# Patient Record
Sex: Male | Born: 1970 | Race: Black or African American | Hispanic: No | Marital: Single | Smoking: Current every day smoker
Health system: Southern US, Community
[De-identification: ages and names within clinical notes are randomized; demographics above are authoritative.]

## PROBLEM LIST (undated history)

## (undated) DIAGNOSIS — W3400XA Accidental discharge from unspecified firearms or gun, initial encounter: Secondary | ICD-10-CM

---

## 2014-08-09 DIAGNOSIS — W3400XA Accidental discharge from unspecified firearms or gun, initial encounter: Secondary | ICD-10-CM

## 2014-08-09 HISTORY — DX: Accidental discharge from unspecified firearms or gun, initial encounter: W34.00XA

## 2015-08-21 ENCOUNTER — Emergency Department (HOSPITAL_COMMUNITY)
Admission: EM | Admit: 2015-08-21 | Discharge: 2015-08-21 | Disposition: A | Discharge status: Home / Self Care | Payer: Self-pay | Attending: Emergency Medicine | Admitting: Emergency Medicine

## 2015-08-21 ENCOUNTER — Emergency Department (HOSPITAL_COMMUNITY): Payer: Self-pay

## 2015-08-21 ENCOUNTER — Encounter (HOSPITAL_COMMUNITY): Payer: Self-pay

## 2015-08-21 DIAGNOSIS — S79912A Unspecified injury of left hip, initial encounter: Secondary | ICD-10-CM | POA: Insufficient documentation

## 2015-08-21 DIAGNOSIS — Y9389 Activity, other specified: Secondary | ICD-10-CM | POA: Insufficient documentation

## 2015-08-21 DIAGNOSIS — Y998 Other external cause status: Secondary | ICD-10-CM | POA: Insufficient documentation

## 2015-08-21 DIAGNOSIS — F17210 Nicotine dependence, cigarettes, uncomplicated: Secondary | ICD-10-CM | POA: Insufficient documentation

## 2015-08-21 DIAGNOSIS — Y92410 Unspecified street and highway as the place of occurrence of the external cause: Secondary | ICD-10-CM | POA: Insufficient documentation

## 2015-08-21 HISTORY — DX: Accidental discharge from unspecified firearms or gun, initial encounter: W34.00XA

## 2015-08-21 MED ORDER — HYDROMORPHONE HCL 1 MG/ML IJ SOLN
2.0000 mg | Freq: Once | INTRAMUSCULAR | Status: DC
  Filled 2015-08-21: qty 2

## 2015-08-21 MED ORDER — HYDROMORPHONE HCL 1 MG/ML IJ SOLN
1.0000 mg | Freq: Once | INTRAMUSCULAR | Status: AC
  Administered 2015-08-21: 1 mg via INTRAVENOUS

## 2015-08-21 MED ORDER — FENTANYL CITRATE (PF) 100 MCG/2ML IJ SOLN
50.0000 ug | Freq: Once | INTRAMUSCULAR | Status: DC
Start: 2015-08-21 — End: 2015-08-21
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
## 2015-08-30 ENCOUNTER — Ambulatory Visit (INDEPENDENT_AMBULATORY_CARE_PROVIDER_SITE_OTHER): Payer: BLUE CROSS/BLUE SHIELD | Admitting: Family Medicine

## 2015-08-30 ENCOUNTER — Encounter: Payer: Self-pay | Admitting: Family Medicine

## 2015-08-30 VITALS — BP 104/64 | HR 68 | Wt 158.8 lb

## 2015-08-30 DIAGNOSIS — M546 Pain in thoracic spine: Secondary | ICD-10-CM | POA: Diagnosis not present

## 2015-08-30 DIAGNOSIS — M549 Dorsalgia, unspecified: Secondary | ICD-10-CM

## 2015-08-30 DIAGNOSIS — M542 Cervicalgia: Secondary | ICD-10-CM | POA: Diagnosis not present
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
## 2016-08-24 ENCOUNTER — Emergency Department (HOSPITAL_COMMUNITY)
Admission: EM | Admit: 2016-08-24 | Discharge: 2016-08-24 | Disposition: A | Discharge status: Home / Self Care | Payer: Self-pay | Attending: Emergency Medicine | Admitting: Emergency Medicine

## 2016-08-24 ENCOUNTER — Encounter (HOSPITAL_COMMUNITY): Payer: Self-pay

## 2016-08-24 DIAGNOSIS — F17210 Nicotine dependence, cigarettes, uncomplicated: Secondary | ICD-10-CM | POA: Insufficient documentation

## 2016-08-24 DIAGNOSIS — Z202 Contact with and (suspected) exposure to infections with a predominantly sexual mode of transmission: Secondary | ICD-10-CM | POA: Insufficient documentation

## 2016-08-24 DIAGNOSIS — Z711 Person with feared health complaint in whom no diagnosis is made: Secondary | ICD-10-CM

## 2016-08-24 DIAGNOSIS — R369 Urethral discharge, unspecified: Secondary | ICD-10-CM | POA: Insufficient documentation

## 2016-08-24 DIAGNOSIS — Z79899 Other long term (current) drug therapy: Secondary | ICD-10-CM | POA: Insufficient documentation

## 2016-08-24 LAB — URINALYSIS, ROUTINE W REFLEX MICROSCOPIC
BILIRUBIN URINE: NEGATIVE
GLUCOSE, UA: NEGATIVE mg/dL
HGB URINE DIPSTICK: NEGATIVE
Ketones, ur: NEGATIVE mg/dL
Nitrite: NEGATIVE
PROTEIN: NEGATIVE mg/dL
Specific Gravity, Urine: 1.023 (ref 1.005–1.030)
Squamous Epithelial / LPF: NONE SEEN
pH: 5 (ref 5.0–8.0)

## 2016-08-24 LAB — HIV ANTIBODY (ROUTINE TESTING W REFLEX): HIV Screen 4th Generation wRfx: NONREACTIVE

## 2016-08-24 LAB — RPR: RPR Ser Ql: NONREACTIVE

## 2016-08-24 MED ORDER — LIDOCAINE HCL 2 % IJ SOLN
INTRAMUSCULAR | Status: AC
  Administered 2016-08-24: 400 mg
  Filled 2016-08-24: qty 20

## 2016-08-24 MED ORDER — CEFTRIAXONE SODIUM 250 MG IJ SOLR
250.0000 mg | Freq: Once | INTRAMUSCULAR | Status: AC
  Administered 2016-08-24: 250 mg via INTRAMUSCULAR
  Filled 2016-08-24: qty 250

## 2016-08-24 MED ORDER — AZITHROMYCIN 250 MG PO TABS
1000.0000 mg | ORAL_TABLET | Freq: Once | ORAL | Status: AC
  Administered 2016-08-24: 1000 mg via ORAL
  Filled 2016-08-24: qty 4
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
## 2016-08-24 NOTE — ED Triage Notes (Signed)
Pt states that he noticed a milky white discharge yesterday, last intercourse was Friday, he doesn't have multiple sex partners. Pt doesn't complain of any pain

## 2016-08-24 NOTE — ED Notes (Signed)
Bed: WTR5 Expected date:  Expected time:  Means of arrival:  Comments: 

## 2016-08-25 LAB — URINE CULTURE: Culture: NO GROWTH

## 2016-08-25 LAB — GC/CHLAMYDIA PROBE AMP (~~LOC~~) NOT AT ARMC
CHLAMYDIA, DNA PROBE: POSITIVE — AB
Neisseria Gonorrhea: POSITIVE — AB

## 2019-09-09 ENCOUNTER — Emergency Department (HOSPITAL_COMMUNITY): Payer: Self-pay

## 2019-09-09 ENCOUNTER — Emergency Department (HOSPITAL_COMMUNITY)
Admission: EM | Admit: 2019-09-09 | Discharge: 2019-09-09 | Disposition: A | Discharge status: Home / Self Care | Payer: Self-pay | Attending: Emergency Medicine | Admitting: Emergency Medicine

## 2019-09-09 ENCOUNTER — Encounter (HOSPITAL_COMMUNITY): Payer: Self-pay | Admitting: Student

## 2019-09-09 ENCOUNTER — Other Ambulatory Visit: Payer: Self-pay

## 2019-09-09 DIAGNOSIS — F17210 Nicotine dependence, cigarettes, uncomplicated: Secondary | ICD-10-CM | POA: Insufficient documentation

## 2019-09-09 DIAGNOSIS — Z79899 Other long term (current) drug therapy: Secondary | ICD-10-CM | POA: Insufficient documentation

## 2019-09-09 DIAGNOSIS — R0789 Other chest pain: Secondary | ICD-10-CM | POA: Insufficient documentation

## 2019-09-09 MED ORDER — METHOCARBAMOL 500 MG PO TABS: 500.0000 mg | ORAL_TABLET | Freq: Three times a day (TID) | ORAL | 0 refills | Status: AC | PRN

## 2019-09-09 NOTE — Discharge Instructions (Addendum)
You were seen in the emergency department today for right-sided chest pain.  Your x-ray did not show any fractures or issues with your underlying lung.  We have given you an incentive spirometer, please use this 5 times per day while you are having pain to help prevent pneumonia.  We are sending you home with Robaxin.   - Robaxin is the muscle relaxer I have prescribed, this is meant to help with muscle tightness. Be aware that this medication may make you drowsy therefore the first time you take this it should be at a time you are in an environment where you can rest. Do not drive or operate heavy machinery when taking this medication. Do not drink alcohol or take other sedating medications with this medicine such as narcotics or benzodiazepines.   We have prescribed you new medication(s) today. Discuss the medications prescribed today with your pharmacist as they can have adverse effects and interactions with your other medicines including over the counter and prescribed medications. Seek medical evaluation if you start to experience new or abnormal symptoms after taking one of these medicines, seek care immediately if you start to experience difficulty breathing, feeling of your throat closing, facial swelling, or rash as these could be indications of a more serious allergic reaction  Please follow-up with your primary care provider within 1 week for reevaluation.  Return to the emergency department for new or worsening symptoms including but not limited to trouble breathing, coughing up blood, coughing up yellow or green sputum, fever, or any other concerns.

## 2019-09-09 NOTE — ED Triage Notes (Signed)
Pt sts on Friday he was using a tool to break a seal on a package at work and the tool his his right chest. Pain and swelling to R chest, worse with inspiration and movement.
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
## 2020-08-18 IMAGING — CR DG RIBS W/ CHEST 3+V*R*
3 series · 3 of 3 positions shown · non-contrast
Comparison: None

CLINICAL DATA: Right-sided chest pain.

EXAM:
RIGHT RIBS AND CHEST - 3+ VIEW

[chest pa]
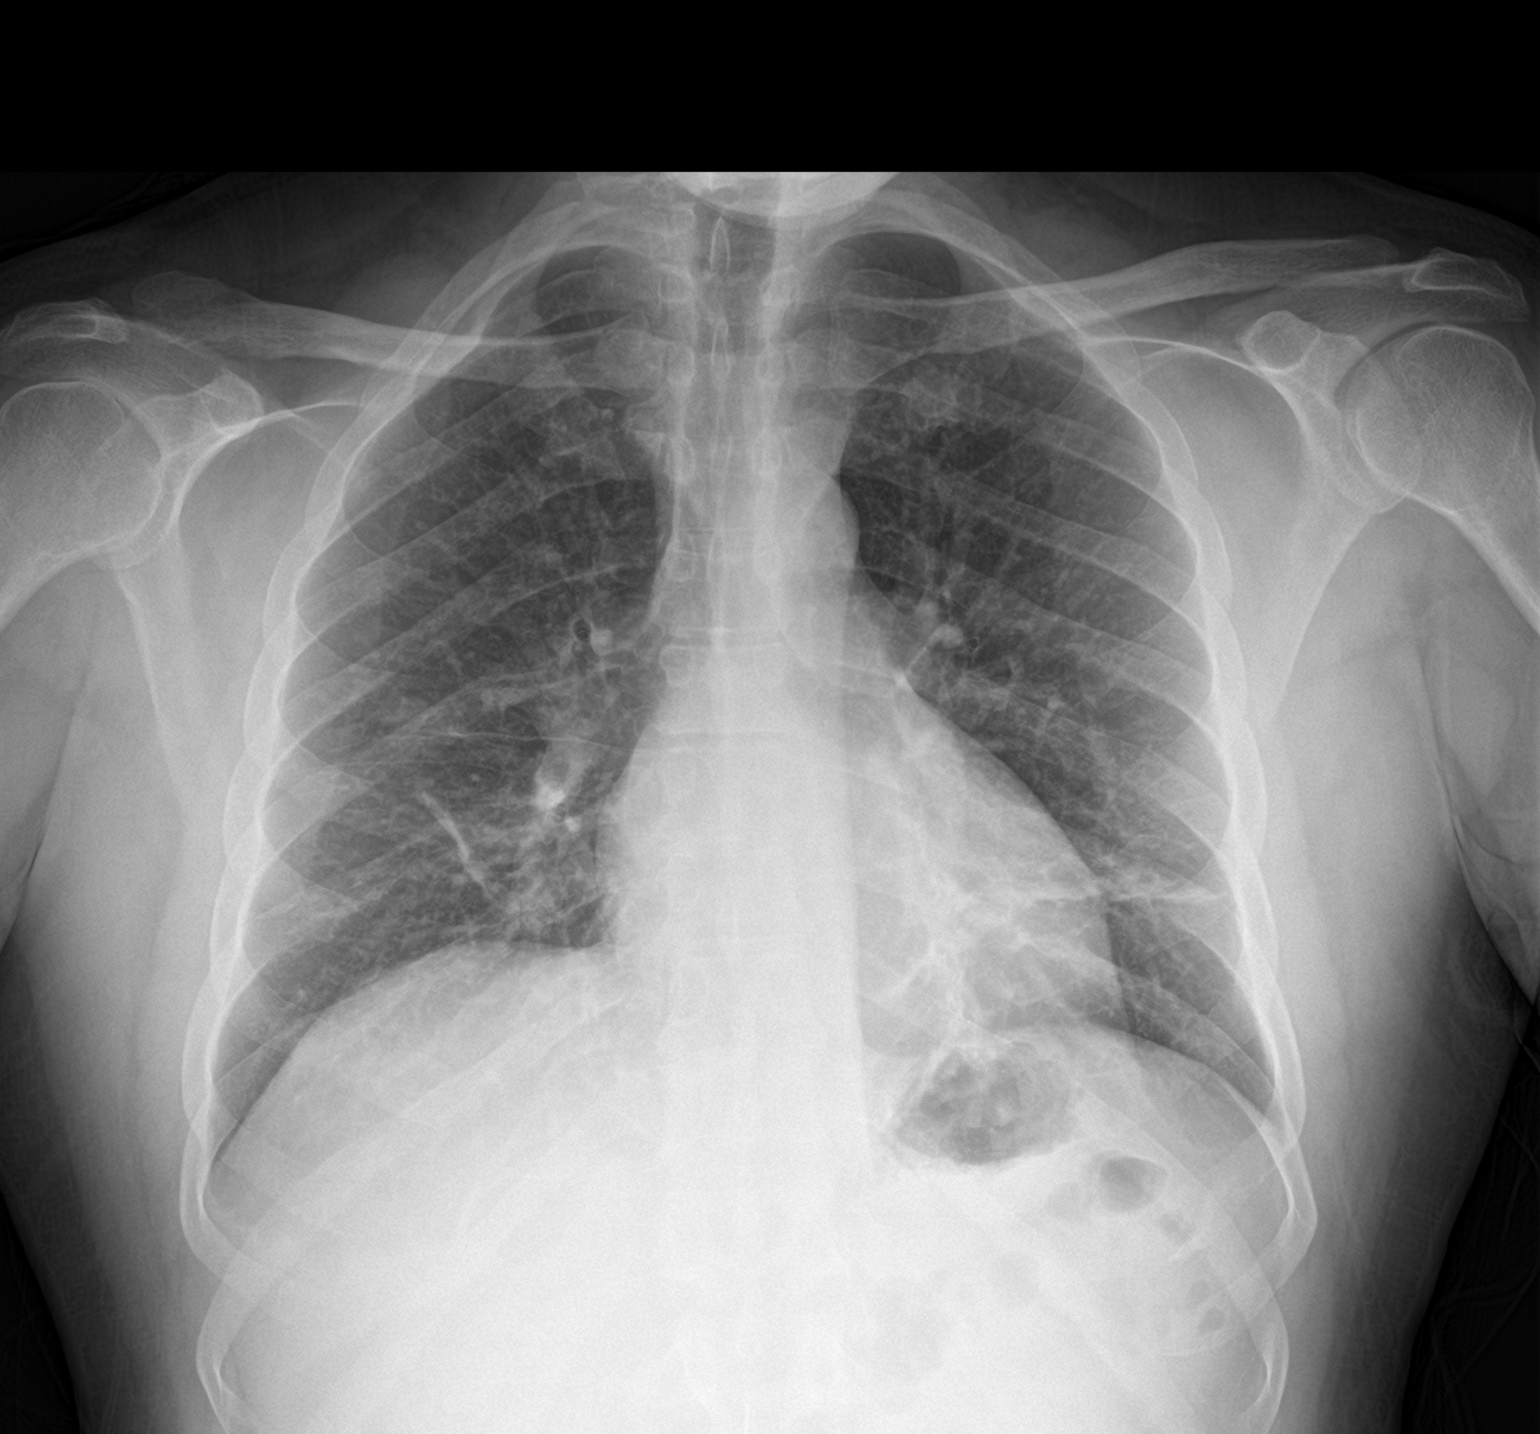

[rib pa]
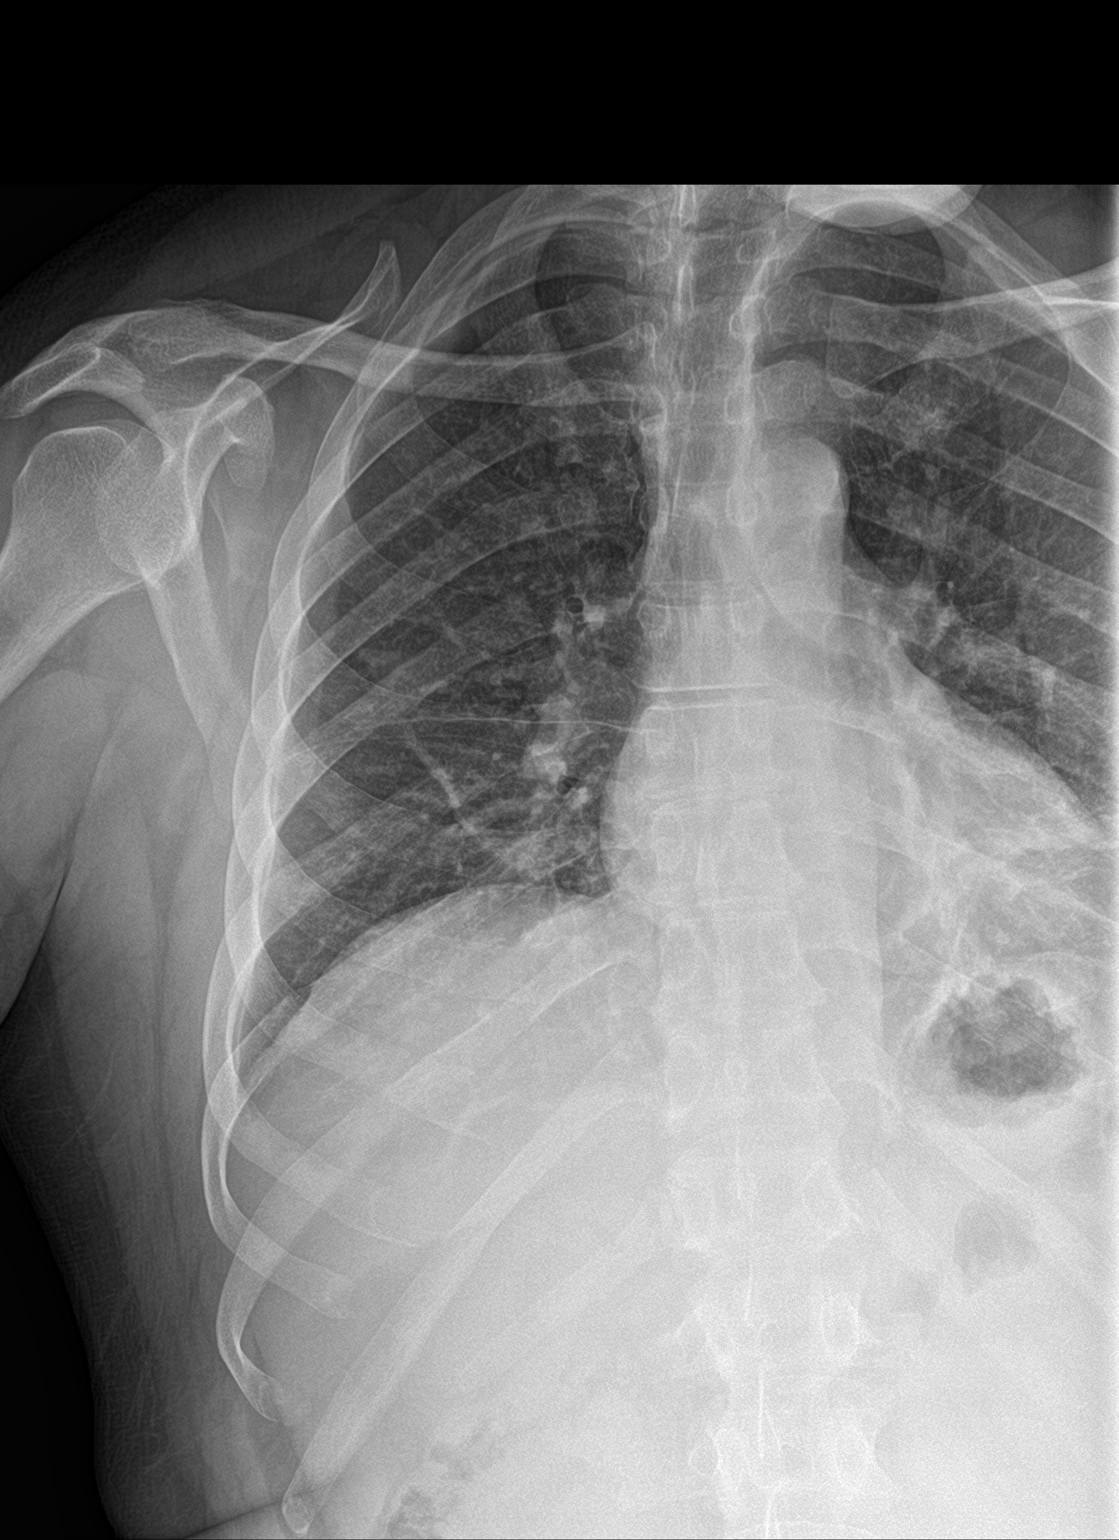

[rib pa obl]
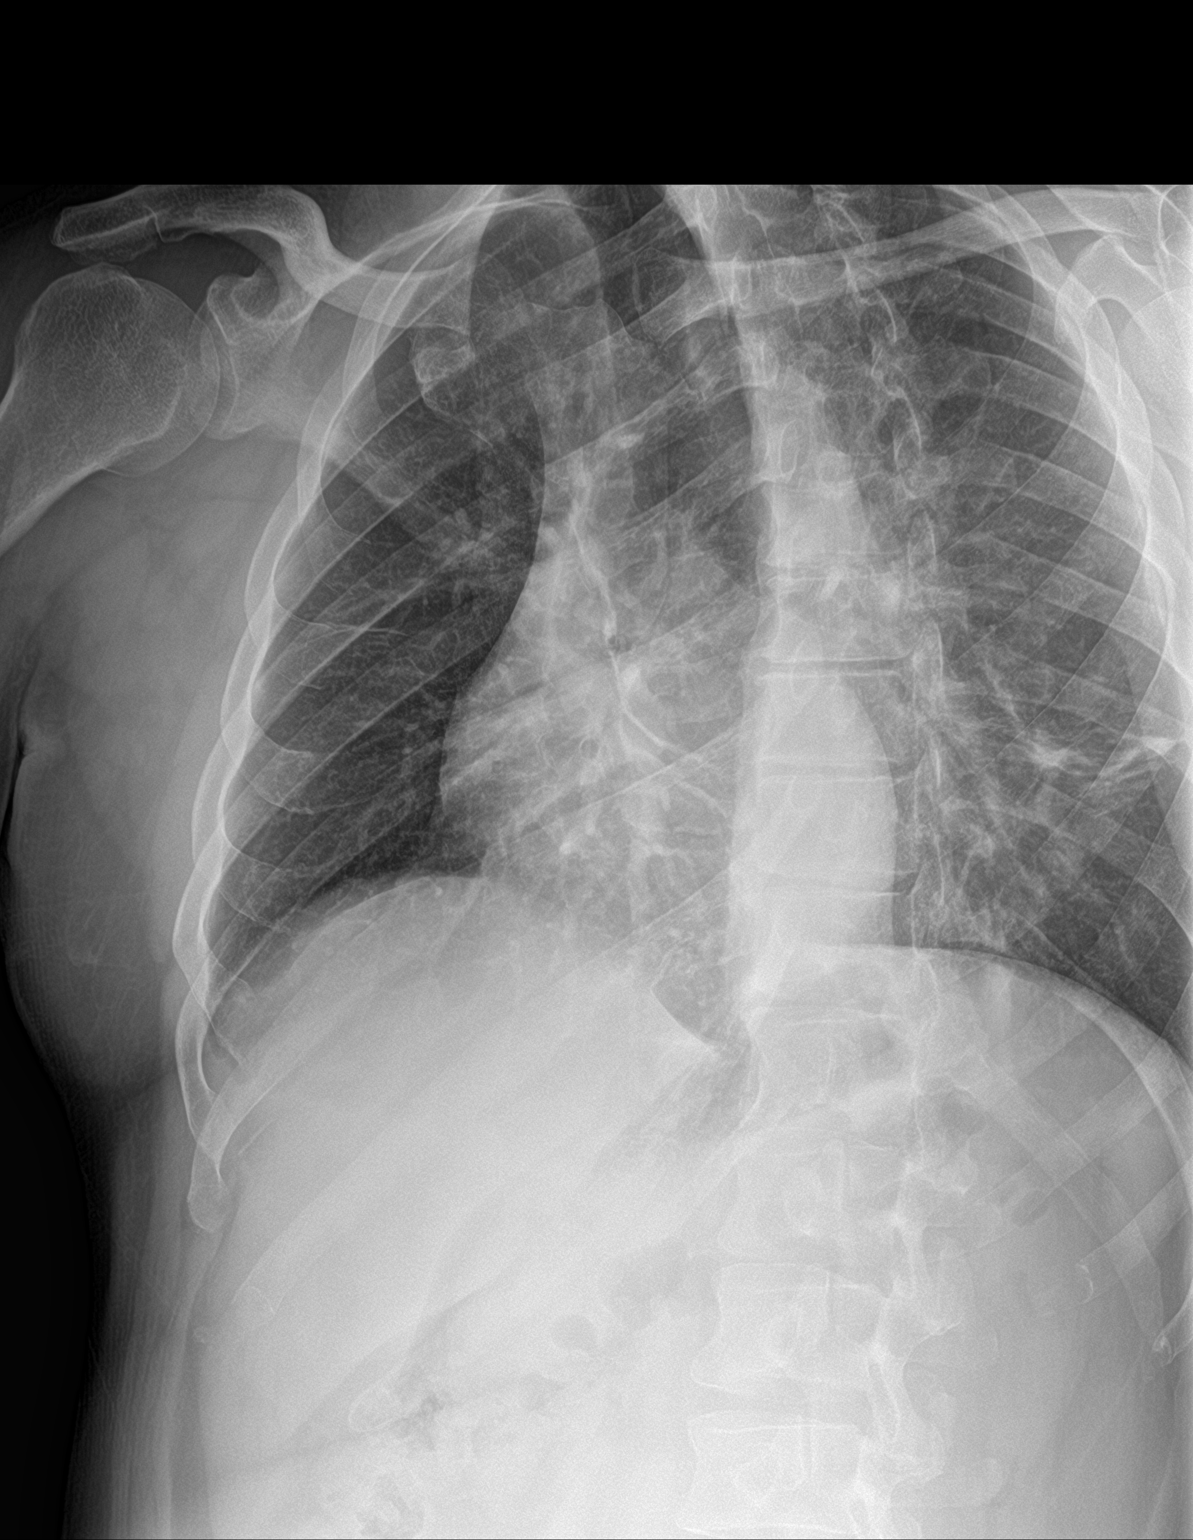

[3 of 3 positions shown; findings below may reference images not displayed]

FINDINGS: No fracture or other bone lesions are seen involving the ribs. There
is no evidence of pneumothorax or pleural effusion. Bilateral lower
lung linear airspace disease likely reflecting atelectasis. Heart
size and mediastinal contours are within normal limits.
IMPRESSION: No acute osseous injury of the right ribs.

No acute cardiopulmonary disease.

## 2022-08-21 ENCOUNTER — Ambulatory Visit (HOSPITAL_COMMUNITY)
Admission: EM | Admit: 2022-08-21 | Discharge: 2022-08-21 | Disposition: A | Discharge status: Home / Self Care | Payer: Self-pay | Attending: Physician Assistant | Admitting: Physician Assistant

## 2022-08-21 ENCOUNTER — Encounter (HOSPITAL_COMMUNITY): Payer: Self-pay

## 2022-08-21 DIAGNOSIS — K047 Periapical abscess without sinus: Secondary | ICD-10-CM

## 2022-08-21 MED ORDER — AMOXICILLIN-POT CLAVULANATE 875-125 MG PO TABS: 1.0000 | ORAL_TABLET | Freq: Two times a day (BID) | ORAL | 0 refills | Status: AC
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
## 2022-08-21 NOTE — ED Triage Notes (Signed)
Pt c/o rt upper broken tooth for a long a time and has been putting white putty in it. States now having swelling and pain. Taking OTC with no relief.

## 2022-08-21 NOTE — Discharge Instructions (Signed)
Gargle with warm salt water.  Start Augmentin twice daily for 7 days.  Follow-up with dentist.  Call them to schedule appointment soon as possible.  If anything worsens and you have increasing swelling, fever, nausea, vomiting, trouble speaking, trouble swallowing, shortness of breath you need to be seen immediately.
